# Patient Record
Sex: Female | Born: 1975 | Race: White | Hispanic: No | Marital: Married | Smoking: Never smoker
Health system: Southern US, Community
[De-identification: ages and names within clinical notes are randomized; demographics above are authoritative.]

## PROBLEM LIST (undated history)

## (undated) HISTORY — PX: BREAST EXCISIONAL BIOPSY: SUR124

---

## 1997-10-22 ENCOUNTER — Other Ambulatory Visit: Admission: RE | Admit: 1997-10-22 | Discharge: 1997-10-22 | Payer: Self-pay | Admitting: Obstetrics & Gynecology

## 1997-11-26 ENCOUNTER — Inpatient Hospital Stay (HOSPITAL_COMMUNITY): Admission: AD | Admit: 1997-11-26 | Discharge: 1997-11-29 | Payer: Self-pay | Admitting: Obstetrics and Gynecology

## 1999-02-20 ENCOUNTER — Other Ambulatory Visit: Admission: RE | Admit: 1999-02-20 | Discharge: 1999-02-20 | Payer: Self-pay | Admitting: Obstetrics & Gynecology

## 2000-07-14 ENCOUNTER — Other Ambulatory Visit: Admission: RE | Admit: 2000-07-14 | Discharge: 2000-07-14 | Payer: Self-pay | Admitting: Obstetrics and Gynecology

## 2001-02-09 ENCOUNTER — Encounter (INDEPENDENT_AMBULATORY_CARE_PROVIDER_SITE_OTHER): Payer: Self-pay

## 2001-02-09 ENCOUNTER — Inpatient Hospital Stay (HOSPITAL_COMMUNITY): Admission: AD | Admit: 2001-02-09 | Discharge: 2001-02-12 | Payer: Self-pay | Admitting: Obstetrics and Gynecology

## 2001-03-15 ENCOUNTER — Other Ambulatory Visit: Admission: RE | Admit: 2001-03-15 | Discharge: 2001-03-15 | Payer: Self-pay | Admitting: Obstetrics and Gynecology

## 2002-06-06 ENCOUNTER — Other Ambulatory Visit: Admission: RE | Admit: 2002-06-06 | Discharge: 2002-06-06 | Payer: Self-pay | Admitting: Obstetrics and Gynecology

## 2004-03-20 ENCOUNTER — Encounter: Admission: RE | Admit: 2004-03-20 | Discharge: 2004-03-20 | Payer: Self-pay | Admitting: Obstetrics and Gynecology

## 2004-09-21 ENCOUNTER — Other Ambulatory Visit: Admission: RE | Admit: 2004-09-21 | Discharge: 2004-09-21 | Payer: Self-pay | Admitting: Obstetrics and Gynecology

## 2006-07-07 ENCOUNTER — Encounter: Admission: RE | Admit: 2006-07-07 | Discharge: 2006-07-07 | Payer: Self-pay | Admitting: Obstetrics and Gynecology

## 2008-08-14 ENCOUNTER — Encounter: Admission: RE | Admit: 2008-08-14 | Discharge: 2008-08-14 | Payer: Self-pay | Admitting: Interventional Radiology

## 2010-03-19 ENCOUNTER — Encounter: Admission: RE | Admit: 2010-03-19 | Discharge: 2010-03-19 | Payer: Self-pay | Admitting: Obstetrics and Gynecology

## 2010-07-12 ENCOUNTER — Encounter: Payer: Self-pay | Admitting: Obstetrics and Gynecology

## 2010-11-06 NOTE — H&P (Signed)
Wendy Bauer, Wendy Bauer              ACCOUNT NO.:  [id number]   MEDICAL RECORD NO.:  [id number]          PATIENT TYPE:  AMB   LOCATION:  SDC                           FACILITY:  [facility]   PHYSICIAN:  Guy Sandifer. Henderson Cloud, M.D. DATE OF BIRTH:  1976-04-11   DATE OF ADMISSION:  08/08/2007  DATE OF DISCHARGE:                              HISTORY & PHYSICAL   CHIEF COMPLAINT:  Desires permanent sterilization.   HISTORY OF PRESENT ILLNESS:  This patient is a 35 year old, married,  white female, G3, P2 with a Jearld Adjutant IUD placed in December 2003.  She has  been using backup contraception and desires permanent sterilization.  She is being admitted for IUD removal as well as laparoscopic bilateral  tubal ligation.  Alternate methods of contraception have been reviewed  as well as risks and complications, permanence and failure rate of tubal  ligation.   PAST MEDICAL HISTORY:  Negative.   PAST SURGICAL HISTORY:  Negative.   FAMILY HISTORY:  Positive for heart attack in maternal grandmother.  Hypertension in sister.  Diabetes in paternal grandmother.  Cancer in  maternal aunt.  CVA in maternal grandmother.   MEDICATIONS:  None.   ALLERGIES:  No known drug allergies.   SOCIAL HISTORY:  Denies tobacco, alcohol or drug abuse.   PAST OBSTETRICAL HISTORY:  Vaginal delivery x2, miscarriage x1.   REVIEW OF SYSTEMS:  NEUROLOGIC:  Denies headache.  CARDIOVASCULAR:  No  chest pain.  PULMONARY:  Denies shortness of breath.  GI:  Denies recent  changes in bowel habits.   PHYSICAL EXAMINATION:  VITAL SIGNS:  Height 5 feet 1 inch, weight 123  pounds, blood pressure 116/80.  HEENT:  Without thyromegaly.  LUNGS:  Clear to auscultation.  HEART:  Regular rate and rhythm.  BACK:  No CVA tenderness.  BREASTS:  No mass, retraction or discharge.  ABDOMEN:  Soft, nontender without masses.  PELVIC:  Vagina and cervix without lesions.  Uterus normal size, mobile,  nontender.  Adnexa nontender without masses.  IUD  string is noted.  EXTREMITIES:  Grossly within normal limits.  NEUROLOGIC:  Grossly within normal limits.   ASSESSMENT:  Desires permanent sterilization.   PLAN:  IUD removal and laparoscopic bilateral tubal ligation.      Guy Sandifer Henderson Cloud, M.D.  Electronically Signed     JET/MEDQ  D:  08/04/2007  T:  08/07/2007  Job:  [id number]

## 2011-06-01 ENCOUNTER — Other Ambulatory Visit (HOSPITAL_COMMUNITY): Payer: Self-pay

## 2011-06-04 ENCOUNTER — Ambulatory Visit (HOSPITAL_COMMUNITY)
Admission: RE | Admit: 2011-06-04 | Payer: PRIVATE HEALTH INSURANCE | Source: Ambulatory Visit | Admitting: Obstetrics and Gynecology

## 2011-06-04 ENCOUNTER — Encounter (HOSPITAL_COMMUNITY): Admission: RE | Payer: Self-pay | Source: Ambulatory Visit

## 2011-06-04 SURGERY — LIGATION, FALLOPIAN TUBE, LAPAROSCOPIC
Anesthesia: General | Laterality: Bilateral

## 2011-09-07 ENCOUNTER — Ambulatory Visit (INDEPENDENT_AMBULATORY_CARE_PROVIDER_SITE_OTHER): Payer: Self-pay | Admitting: General Surgery

## 2013-04-05 ENCOUNTER — Telehealth: Payer: Self-pay | Admitting: Obstetrics and Gynecology

## 2014-06-21 HISTORY — PX: HAND SURGERY: SHX662

## 2015-09-17 ENCOUNTER — Ambulatory Visit
Admission: RE | Admit: 2015-09-17 | Discharge: 2015-09-17 | Disposition: A | Discharge status: Home / Self Care | Payer: PRIVATE HEALTH INSURANCE | Source: Ambulatory Visit

## 2015-09-17 ENCOUNTER — Ambulatory Visit
Admission: RE | Admit: 2015-09-17 | Discharge: 2015-09-17 | Disposition: A | Discharge status: Home / Self Care | Payer: PRIVATE HEALTH INSURANCE | Source: Ambulatory Visit | Attending: Nurse Practitioner | Admitting: Nurse Practitioner

## 2015-09-17 ENCOUNTER — Other Ambulatory Visit: Payer: Self-pay | Admitting: Nurse Practitioner

## 2015-09-17 ENCOUNTER — Other Ambulatory Visit: Payer: Self-pay

## 2015-09-17 DIAGNOSIS — Z1231 Encounter for screening mammogram for malignant neoplasm of breast: Secondary | ICD-10-CM

## 2015-09-17 DIAGNOSIS — R928 Other abnormal and inconclusive findings on diagnostic imaging of breast: Secondary | ICD-10-CM

## 2017-02-22 ENCOUNTER — Ambulatory Visit
Admission: RE | Admit: 2017-02-22 | Discharge: 2017-02-22 | Disposition: A | Discharge status: Home / Self Care | Payer: PRIVATE HEALTH INSURANCE | Source: Ambulatory Visit | Attending: Nurse Practitioner | Admitting: Nurse Practitioner

## 2017-02-22 ENCOUNTER — Other Ambulatory Visit: Payer: Self-pay | Admitting: Nurse Practitioner

## 2017-02-22 DIAGNOSIS — Z1231 Encounter for screening mammogram for malignant neoplasm of breast: Secondary | ICD-10-CM

## 2018-03-28 ENCOUNTER — Other Ambulatory Visit: Payer: Self-pay | Admitting: Family Medicine

## 2018-03-28 DIAGNOSIS — Z1231 Encounter for screening mammogram for malignant neoplasm of breast: Secondary | ICD-10-CM

## 2018-03-29 ENCOUNTER — Ambulatory Visit
Admission: RE | Admit: 2018-03-29 | Discharge: 2018-03-29 | Disposition: A | Discharge status: Home / Self Care | Payer: PRIVATE HEALTH INSURANCE | Source: Ambulatory Visit | Attending: Family Medicine | Admitting: Family Medicine

## 2018-03-29 ENCOUNTER — Other Ambulatory Visit: Payer: Self-pay | Admitting: Family Medicine

## 2018-03-29 DIAGNOSIS — N6310 Unspecified lump in the right breast, unspecified quadrant: Secondary | ICD-10-CM

## 2018-03-29 DIAGNOSIS — Z1231 Encounter for screening mammogram for malignant neoplasm of breast: Secondary | ICD-10-CM

## 2019-04-10 ENCOUNTER — Other Ambulatory Visit: Payer: Self-pay | Admitting: Family Medicine

## 2019-04-10 DIAGNOSIS — N63 Unspecified lump in unspecified breast: Secondary | ICD-10-CM

## 2019-08-14 ENCOUNTER — Other Ambulatory Visit: Payer: Self-pay

## 2019-08-14 ENCOUNTER — Ambulatory Visit
Admission: RE | Admit: 2019-08-14 | Discharge: 2019-08-14 | Disposition: A | Discharge status: Home / Self Care | Payer: PRIVATE HEALTH INSURANCE | Source: Ambulatory Visit | Attending: Family Medicine | Admitting: Family Medicine

## 2019-08-14 DIAGNOSIS — N63 Unspecified lump in unspecified breast: Secondary | ICD-10-CM

## 2019-09-18 ENCOUNTER — Ambulatory Visit
Admission: RE | Admit: 2019-09-18 | Discharge: 2019-09-18 | Disposition: A | Discharge status: Home / Self Care | Payer: PRIVATE HEALTH INSURANCE | Source: Ambulatory Visit | Attending: Family Medicine | Admitting: Family Medicine

## 2019-09-18 ENCOUNTER — Other Ambulatory Visit: Payer: Self-pay | Admitting: Family Medicine

## 2019-09-18 DIAGNOSIS — R079 Chest pain, unspecified: Secondary | ICD-10-CM

## 2020-08-19 ENCOUNTER — Ambulatory Visit
Admission: RE | Admit: 2020-08-19 | Discharge: 2020-08-19 | Disposition: A | Discharge status: Home / Self Care | Payer: Managed Care, Other (non HMO) | Source: Ambulatory Visit | Attending: Family Medicine | Admitting: Family Medicine

## 2020-08-19 ENCOUNTER — Other Ambulatory Visit: Payer: Self-pay

## 2020-08-19 ENCOUNTER — Other Ambulatory Visit: Payer: Self-pay | Admitting: Family Medicine

## 2020-08-19 DIAGNOSIS — N63 Unspecified lump in unspecified breast: Secondary | ICD-10-CM

## 2021-10-09 IMAGING — MG DIGITAL DIAGNOSTIC BILAT W/ TOMO W/ CAD
8 series · 9 of 24 positions shown · non-contrast
Comparison: Previous exam(s).

CLINICAL DATA: Two year follow-up for probably benign fibrocystic
changes in the RIGHT breast.

EXAM:
DIGITAL DIAGNOSTIC BILATERAL MAMMOGRAM WITH TOMOSYNTHESIS AND CAD
TECHNIQUE: Bilateral digital diagnostic mammography and breast tomosynthesis
was performed. The images were evaluated with computer-aided
detection.

[R CC synth-2D]
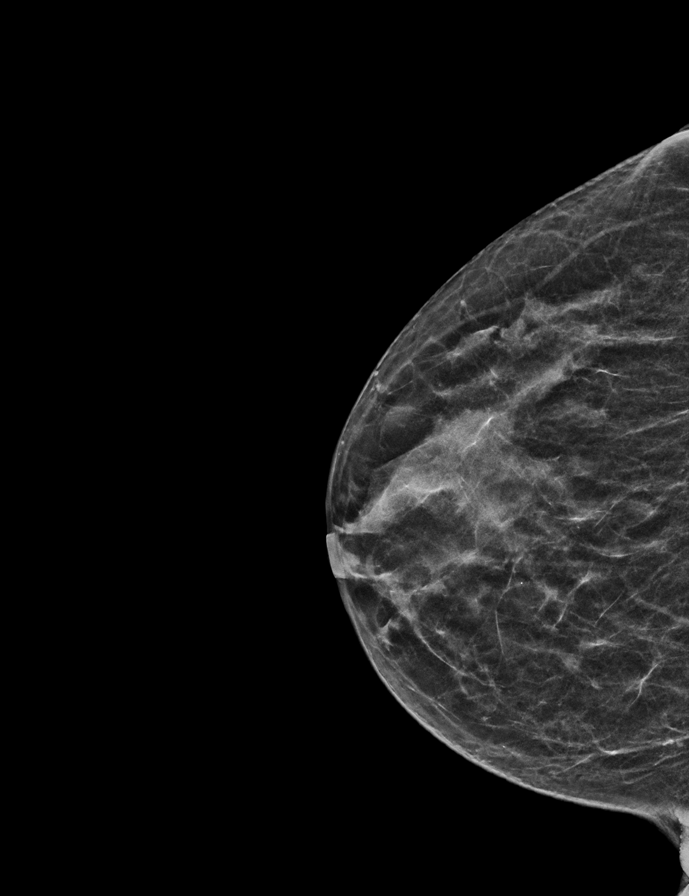

[L MLO synth-2D]
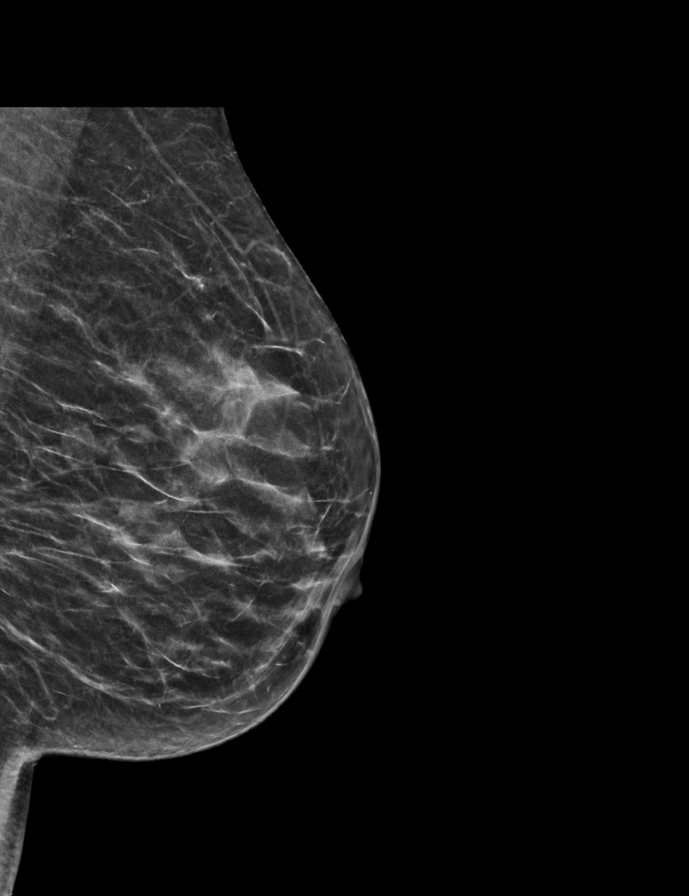

[R MLO synth-2D]
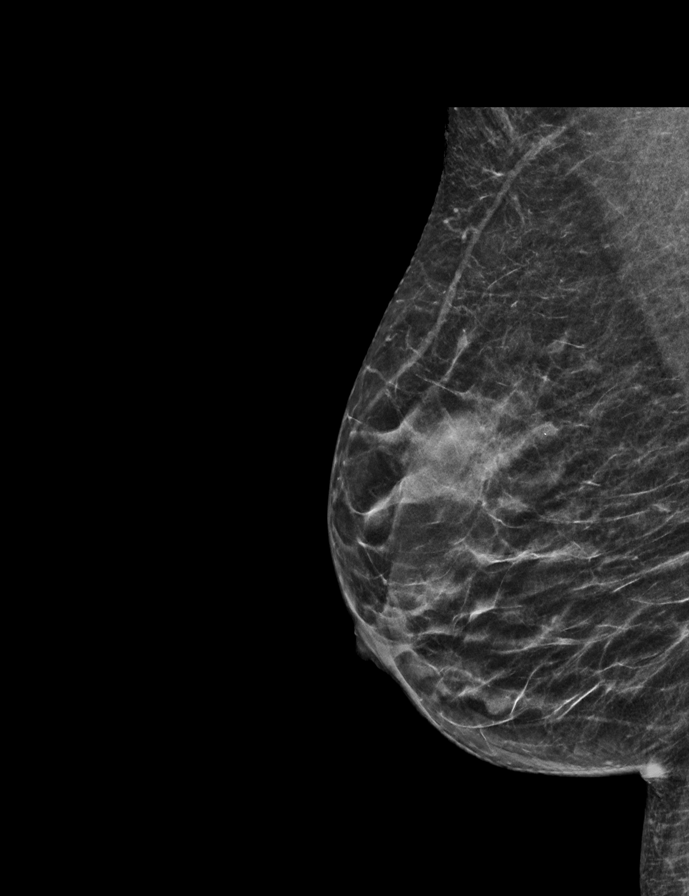

[L CC synth-2D]
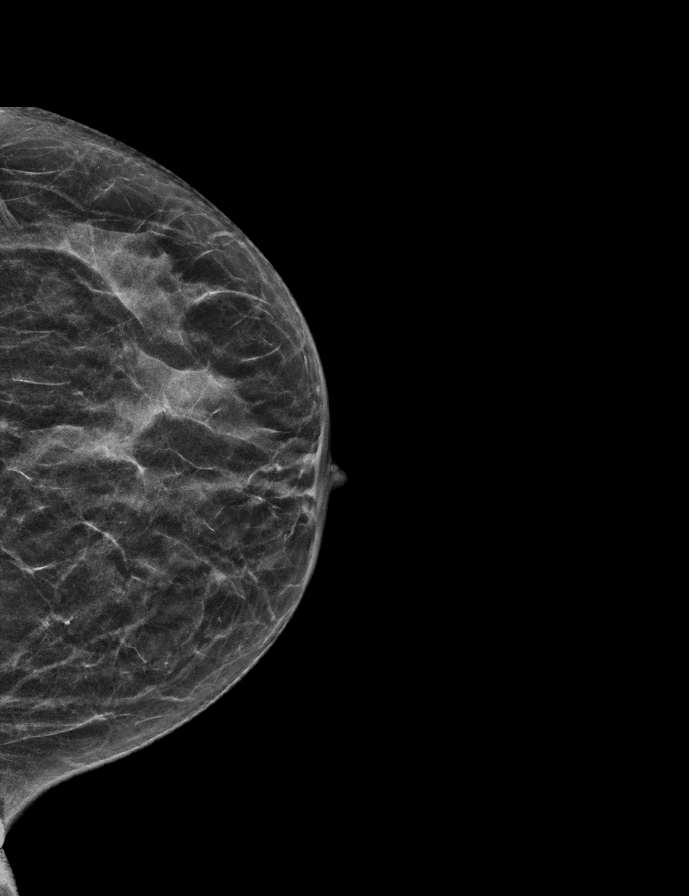

[L MLO tomo · 2 of 48 frames shown]
[frame 16/48]
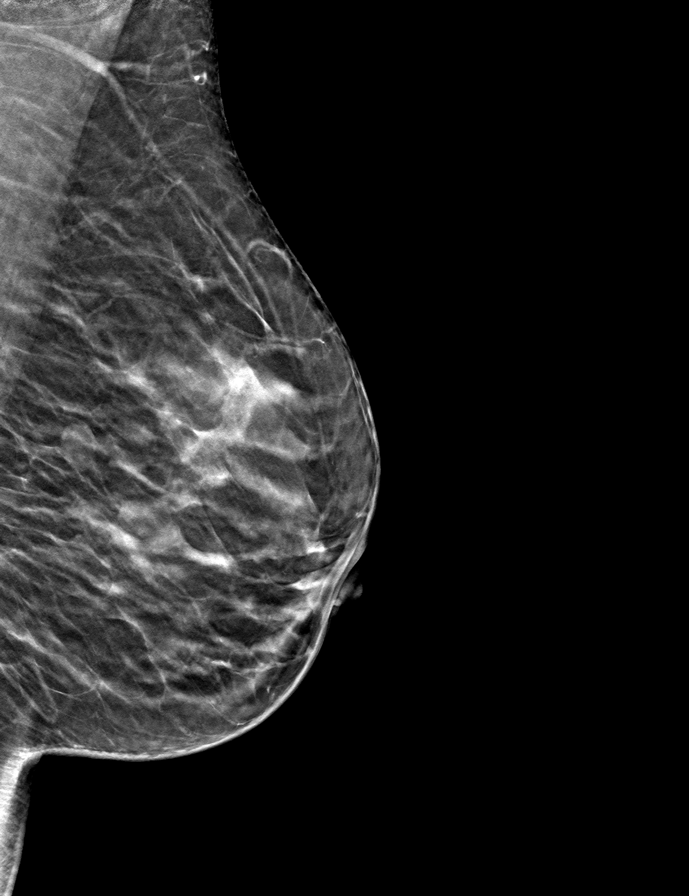
[frame 25/48]
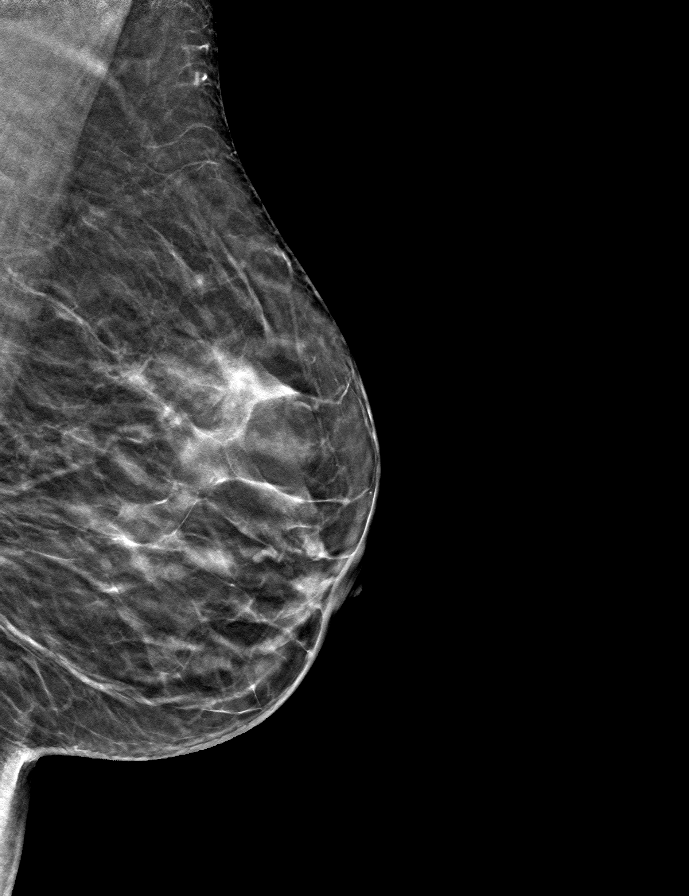

[R MLO tomo · tomo slice 23/46.0]
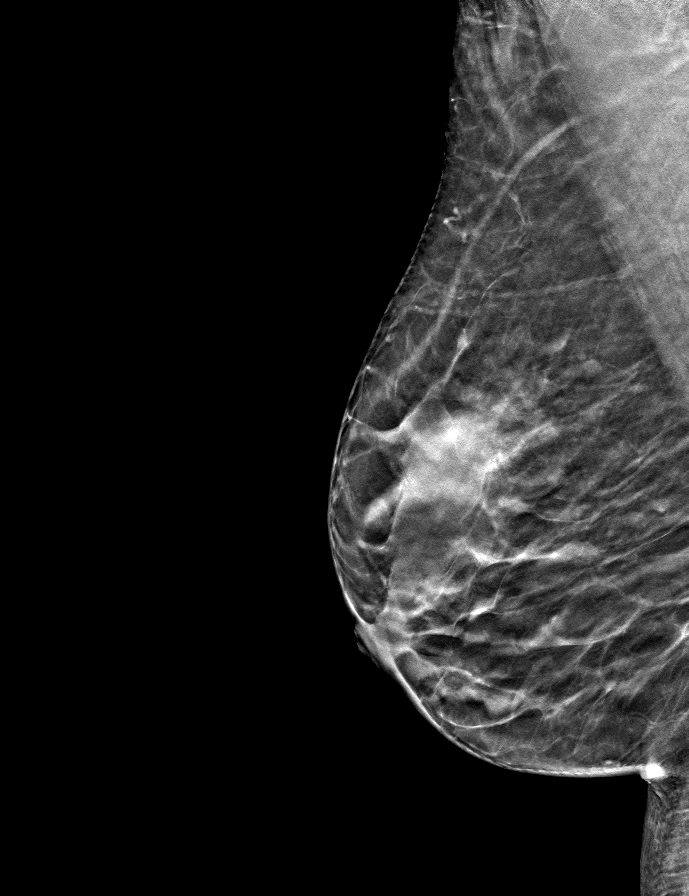

[L CC tomo · tomo slice 22/43.0]
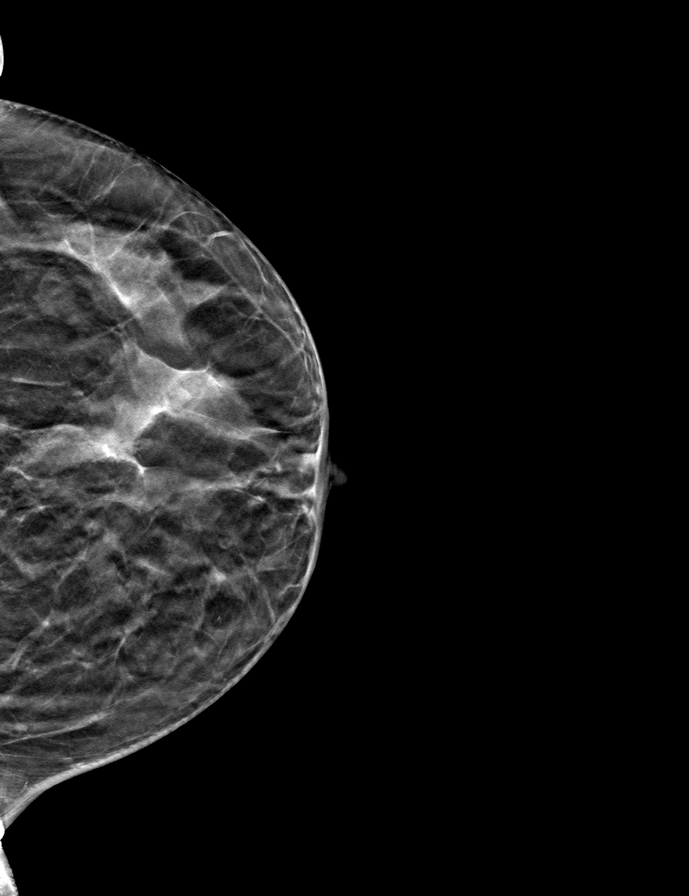

[R CC tomo · tomo slice 21/41.0]
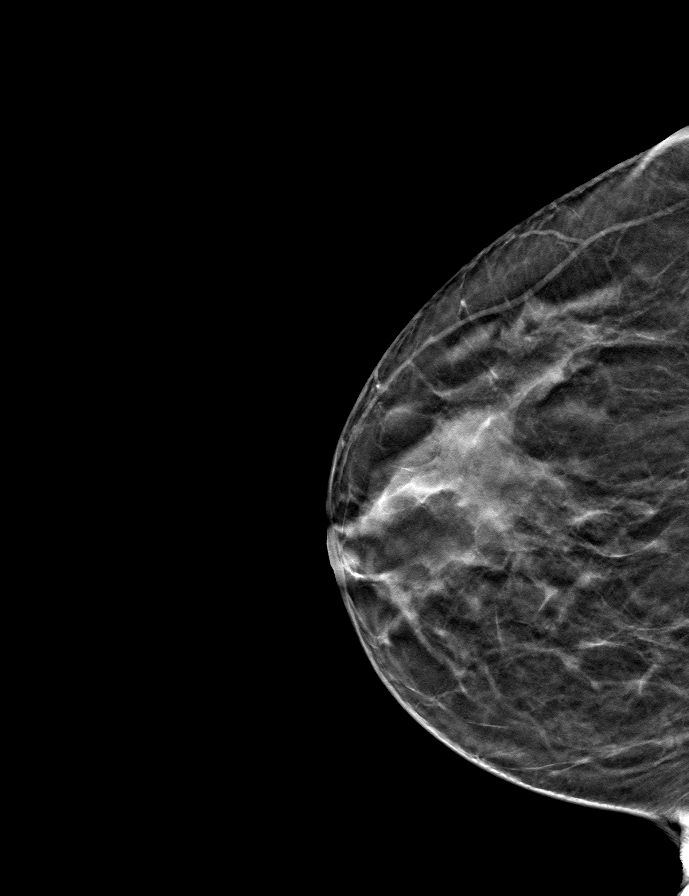

[9 of 24 positions shown; findings below may reference images not displayed]

ACR Breast Density Category b: There are scattered areas of
fibroglandular density.
FINDINGS: Stable appearance of asymmetry in the UPPER-OUTER QUADRANT of the
RIGHT breast, consistent with known fibrocystic changes on
ultrasound. No new or suspicious findings in either breast.
IMPRESSION: No mammographic evidence for malignancy. Stable fibrocystic changes
in the UPPER-OUTER QUADRANT of the RIGHT breast.

RECOMMENDATION:
Screening mammogram in one year.(Code:[id number])

I have discussed the findings and recommendations with the patient.
If applicable, a reminder letter will be sent to the patient
regarding the next appointment.

BI-RADS CATEGORY  2: Benign.

## 2022-03-23 ENCOUNTER — Ambulatory Visit
Admission: RE | Admit: 2022-03-23 | Discharge: 2022-03-23 | Disposition: A | Discharge status: Home / Self Care | Payer: Managed Care, Other (non HMO) | Source: Ambulatory Visit | Attending: Obstetrics & Gynecology | Admitting: Obstetrics & Gynecology

## 2022-03-23 ENCOUNTER — Other Ambulatory Visit: Payer: Self-pay | Admitting: Obstetrics & Gynecology

## 2022-03-23 DIAGNOSIS — R9230 Dense breasts, unspecified: Secondary | ICD-10-CM

## 2022-03-23 DIAGNOSIS — R921 Mammographic calcification found on diagnostic imaging of breast: Secondary | ICD-10-CM

## 2022-03-23 DIAGNOSIS — Z1231 Encounter for screening mammogram for malignant neoplasm of breast: Secondary | ICD-10-CM

## 2022-03-26 ENCOUNTER — Other Ambulatory Visit: Payer: Self-pay | Admitting: Obstetrics & Gynecology

## 2022-03-26 DIAGNOSIS — R921 Mammographic calcification found on diagnostic imaging of breast: Secondary | ICD-10-CM

## 2022-03-26 DIAGNOSIS — N6320 Unspecified lump in the left breast, unspecified quadrant: Secondary | ICD-10-CM

## 2022-03-30 ENCOUNTER — Ambulatory Visit
Admission: RE | Admit: 2022-03-30 | Discharge: 2022-03-30 | Disposition: A | Discharge status: Home / Self Care | Payer: Managed Care, Other (non HMO) | Source: Ambulatory Visit | Attending: Obstetrics & Gynecology | Admitting: Obstetrics & Gynecology

## 2022-03-30 DIAGNOSIS — R921 Mammographic calcification found on diagnostic imaging of breast: Secondary | ICD-10-CM

## 2022-03-30 DIAGNOSIS — N6320 Unspecified lump in the left breast, unspecified quadrant: Secondary | ICD-10-CM

## 2022-04-08 ENCOUNTER — Other Ambulatory Visit: Payer: Managed Care, Other (non HMO)

## 2022-04-21 ENCOUNTER — Other Ambulatory Visit: Payer: Self-pay | Admitting: General Surgery

## 2022-04-21 DIAGNOSIS — N6092 Unspecified benign mammary dysplasia of left breast: Secondary | ICD-10-CM

## 2022-06-30 ENCOUNTER — Encounter (HOSPITAL_BASED_OUTPATIENT_CLINIC_OR_DEPARTMENT_OTHER): Payer: Self-pay | Admitting: General Surgery

## 2022-07-06 ENCOUNTER — Ambulatory Visit
Admission: RE | Admit: 2022-07-06 | Discharge: 2022-07-06 | Disposition: A | Discharge status: Home / Self Care | Payer: Managed Care, Other (non HMO) | Source: Ambulatory Visit | Attending: General Surgery | Admitting: General Surgery

## 2022-07-06 DIAGNOSIS — N6092 Unspecified benign mammary dysplasia of left breast: Secondary | ICD-10-CM

## 2022-07-06 HISTORY — PX: BREAST BIOPSY: SHX20

## 2022-07-06 MED ORDER — ENSURE PRE-SURGERY PO LIQD: 296.0000 mL | Freq: Once | ORAL | Status: DC

## 2022-07-06 NOTE — Progress Notes (Signed)

## 2022-07-07 ENCOUNTER — Encounter (HOSPITAL_BASED_OUTPATIENT_CLINIC_OR_DEPARTMENT_OTHER)
Admission: RE | Disposition: A | Discharge status: Home / Self Care | Payer: Self-pay | Source: Home / Self Care | Attending: General Surgery

## 2022-07-07 ENCOUNTER — Other Ambulatory Visit: Payer: Self-pay

## 2022-07-07 ENCOUNTER — Ambulatory Visit
Admission: RE | Admit: 2022-07-07 | Discharge: 2022-07-07 | Disposition: A | Discharge status: Home / Self Care | Payer: Managed Care, Other (non HMO) | Source: Ambulatory Visit | Attending: General Surgery | Admitting: General Surgery

## 2022-07-07 ENCOUNTER — Encounter (HOSPITAL_BASED_OUTPATIENT_CLINIC_OR_DEPARTMENT_OTHER): Payer: Self-pay | Admitting: General Surgery

## 2022-07-07 ENCOUNTER — Ambulatory Visit (HOSPITAL_BASED_OUTPATIENT_CLINIC_OR_DEPARTMENT_OTHER): Payer: Managed Care, Other (non HMO) | Admitting: Certified Registered Nurse Anesthetist

## 2022-07-07 ENCOUNTER — Ambulatory Visit (HOSPITAL_BASED_OUTPATIENT_CLINIC_OR_DEPARTMENT_OTHER)
Admission: RE | Admit: 2022-07-07 | Discharge: 2022-07-07 | Disposition: A | Discharge status: Home / Self Care | Payer: Managed Care, Other (non HMO) | Attending: General Surgery | Admitting: General Surgery

## 2022-07-07 DIAGNOSIS — N6320 Unspecified lump in the left breast, unspecified quadrant: Secondary | ICD-10-CM | POA: Diagnosis not present

## 2022-07-07 DIAGNOSIS — N6082 Other benign mammary dysplasias of left breast: Secondary | ICD-10-CM | POA: Insufficient documentation

## 2022-07-07 DIAGNOSIS — N6092 Unspecified benign mammary dysplasia of left breast: Secondary | ICD-10-CM

## 2022-07-07 DIAGNOSIS — Z803 Family history of malignant neoplasm of breast: Secondary | ICD-10-CM | POA: Insufficient documentation

## 2022-07-07 DIAGNOSIS — Z419 Encounter for procedure for purposes other than remedying health state, unspecified: Secondary | ICD-10-CM

## 2022-07-07 HISTORY — PX: RADIOACTIVE SEED GUIDED EXCISIONAL BREAST BIOPSY: SHX6490

## 2022-07-07 LAB — POCT PREGNANCY, URINE: Preg Test, Ur: NEGATIVE

## 2022-07-07 SURGERY — RADIOACTIVE SEED GUIDED BREAST BIOPSY
Anesthesia: General | Site: Breast | Laterality: Left

## 2022-07-07 MED ORDER — OXYCODONE HCL 5 MG/5ML PO SOLN: 5.0000 mg | Freq: Once | ORAL | Status: AC | PRN

## 2022-07-07 MED ORDER — GABAPENTIN 300 MG PO CAPS
ORAL_CAPSULE | ORAL | Status: AC
  Filled 2022-07-07: qty 1

## 2022-07-07 MED ORDER — FENTANYL CITRATE (PF) 100 MCG/2ML IJ SOLN
INTRAMUSCULAR | Status: AC
  Filled 2022-07-07: qty 2

## 2022-07-07 MED ORDER — MIDAZOLAM HCL 5 MG/5ML IJ SOLN
INTRAMUSCULAR | Status: DC | PRN
  Administered 2022-07-07: 2 mg via INTRAVENOUS

## 2022-07-07 MED ORDER — LIDOCAINE 2% (20 MG/ML) 5 ML SYRINGE
INTRAMUSCULAR | Status: AC
  Filled 2022-07-07: qty 5

## 2022-07-07 MED ORDER — KETOROLAC TROMETHAMINE 15 MG/ML IJ SOLN
INTRAMUSCULAR | Status: AC
  Filled 2022-07-07: qty 1

## 2022-07-07 MED ORDER — GABAPENTIN 300 MG PO CAPS
300.0000 mg | ORAL_CAPSULE | Freq: Once | ORAL | Status: AC
  Administered 2022-07-07: 300 mg via ORAL

## 2022-07-07 MED ORDER — HYDROMORPHONE HCL 1 MG/ML IJ SOLN: 0.2500 mg | INTRAMUSCULAR | Status: DC | PRN

## 2022-07-07 MED ORDER — ONDANSETRON HCL 4 MG/2ML IJ SOLN
INTRAMUSCULAR | Status: AC
  Filled 2022-07-07: qty 2

## 2022-07-07 MED ORDER — DEXAMETHASONE SODIUM PHOSPHATE 10 MG/ML IJ SOLN
INTRAMUSCULAR | Status: DC | PRN
  Administered 2022-07-07: 10 mg via INTRAVENOUS

## 2022-07-07 MED ORDER — LIDOCAINE HCL (CARDIAC) PF 100 MG/5ML IV SOSY
PREFILLED_SYRINGE | INTRAVENOUS | Status: DC | PRN
  Administered 2022-07-07: 60 mg via INTRAVENOUS

## 2022-07-07 MED ORDER — AMISULPRIDE (ANTIEMETIC) 5 MG/2ML IV SOLN: 10.0000 mg | Freq: Once | INTRAVENOUS | Status: DC | PRN

## 2022-07-07 MED ORDER — CHLORHEXIDINE GLUCONATE CLOTH 2 % EX PADS: 6.0000 | MEDICATED_PAD | Freq: Once | CUTANEOUS | Status: DC

## 2022-07-07 MED ORDER — BUPIVACAINE HCL (PF) 0.25 % IJ SOLN
INTRAMUSCULAR | Status: DC | PRN
  Administered 2022-07-07: 10 mL

## 2022-07-07 MED ORDER — CEFAZOLIN SODIUM-DEXTROSE 2-4 GM/100ML-% IV SOLN
2.0000 g | INTRAVENOUS | Status: AC
  Administered 2022-07-07: 2 g via INTRAVENOUS

## 2022-07-07 MED ORDER — ACETAMINOPHEN 500 MG PO TABS
1000.0000 mg | ORAL_TABLET | ORAL | Status: AC
  Administered 2022-07-07: 1000 mg via ORAL

## 2022-07-07 MED ORDER — MEPERIDINE HCL 25 MG/ML IJ SOLN: 6.2500 mg | INTRAMUSCULAR | Status: DC | PRN

## 2022-07-07 MED ORDER — PROPOFOL 10 MG/ML IV BOLUS
INTRAVENOUS | Status: AC
  Filled 2022-07-07: qty 20

## 2022-07-07 MED ORDER — PROMETHAZINE HCL 25 MG/ML IJ SOLN: 6.2500 mg | INTRAMUSCULAR | Status: DC | PRN

## 2022-07-07 MED ORDER — FENTANYL CITRATE (PF) 100 MCG/2ML IJ SOLN
INTRAMUSCULAR | Status: DC | PRN
  Administered 2022-07-07 (×2): 25 ug via INTRAVENOUS

## 2022-07-07 MED ORDER — ONDANSETRON HCL 4 MG/2ML IJ SOLN
INTRAMUSCULAR | Status: DC | PRN
  Administered 2022-07-07: 4 mg via INTRAVENOUS

## 2022-07-07 MED ORDER — SCOPOLAMINE 1 MG/3DAYS TD PT72
1.0000 | MEDICATED_PATCH | TRANSDERMAL | Status: DC
  Administered 2022-07-07: 1.5 mg via TRANSDERMAL

## 2022-07-07 MED ORDER — OXYCODONE HCL 5 MG PO TABS
ORAL_TABLET | ORAL | Status: AC
  Filled 2022-07-07: qty 1

## 2022-07-07 MED ORDER — SCOPOLAMINE 1 MG/3DAYS TD PT72
MEDICATED_PATCH | TRANSDERMAL | Status: AC
  Filled 2022-07-07: qty 1

## 2022-07-07 MED ORDER — CEFAZOLIN SODIUM-DEXTROSE 2-4 GM/100ML-% IV SOLN
INTRAVENOUS | Status: AC
  Filled 2022-07-07: qty 100

## 2022-07-07 MED ORDER — OXYCODONE HCL 5 MG PO TABS
5.0000 mg | ORAL_TABLET | Freq: Once | ORAL | Status: AC | PRN
  Administered 2022-07-07: 5 mg via ORAL

## 2022-07-07 MED ORDER — PROPOFOL 10 MG/ML IV BOLUS
INTRAVENOUS | Status: DC | PRN
  Administered 2022-07-07: 120 mg via INTRAVENOUS

## 2022-07-07 MED ORDER — DEXAMETHASONE SODIUM PHOSPHATE 10 MG/ML IJ SOLN
INTRAMUSCULAR | Status: AC
  Filled 2022-07-07: qty 1

## 2022-07-07 MED ORDER — LACTATED RINGERS IV SOLN: INTRAVENOUS | Status: DC

## 2022-07-07 MED ORDER — ACETAMINOPHEN 500 MG PO TABS
ORAL_TABLET | ORAL | Status: AC
  Filled 2022-07-07: qty 2

## 2022-07-07 MED ORDER — KETOROLAC TROMETHAMINE 15 MG/ML IJ SOLN
15.0000 mg | INTRAMUSCULAR | Status: AC
  Administered 2022-07-07: 15 mg via INTRAVENOUS

## 2022-07-07 MED ORDER — MIDAZOLAM HCL 2 MG/2ML IJ SOLN
INTRAMUSCULAR | Status: AC
  Filled 2022-07-07: qty 2

## 2022-07-07 SURGICAL SUPPLY — 54 items
ADH SKN CLS APL DERMABOND .7 (GAUZE/BANDAGES/DRESSINGS) ×1
APL PRP STRL LF DISP 70% ISPRP (MISCELLANEOUS) ×1
APPLIER CLIP 9.375 MED OPEN (MISCELLANEOUS)
APR CLP MED 9.3 20 MLT OPN (MISCELLANEOUS)
BINDER BREAST LRG (GAUZE/BANDAGES/DRESSINGS) IMPLANT
BINDER BREAST MEDIUM (GAUZE/BANDAGES/DRESSINGS) IMPLANT
BINDER BREAST XLRG (GAUZE/BANDAGES/DRESSINGS) IMPLANT
BINDER BREAST XXLRG (GAUZE/BANDAGES/DRESSINGS) IMPLANT
BLADE SURG 15 STRL LF DISP TIS (BLADE) ×2 IMPLANT
BLADE SURG 15 STRL SS (BLADE) ×1
CANISTER SUC SOCK COL 7IN (MISCELLANEOUS) IMPLANT
CANISTER SUCT 1200ML W/VALVE (MISCELLANEOUS) IMPLANT
CHLORAPREP W/TINT 26 (MISCELLANEOUS) ×2 IMPLANT
CLIP APPLIE 9.375 MED OPEN (MISCELLANEOUS) IMPLANT
CLIP TI WIDE RED SMALL 6 (CLIP) IMPLANT
COVER BACK TABLE 60X90IN (DRAPES) ×2 IMPLANT
COVER MAYO STAND STRL (DRAPES) ×2 IMPLANT
COVER PROBE CYLINDRICAL 5X96 (MISCELLANEOUS) ×2 IMPLANT
DERMABOND ADVANCED .7 DNX12 (GAUZE/BANDAGES/DRESSINGS) ×2 IMPLANT
DRAPE LAPAROSCOPIC ABDOMINAL (DRAPES) ×2 IMPLANT
DRAPE UTILITY XL STRL (DRAPES) ×2 IMPLANT
DRSG TEGADERM 4X4.75 (GAUZE/BANDAGES/DRESSINGS) IMPLANT
ELECT COATED BLADE 2.86 ST (ELECTRODE) ×2 IMPLANT
ELECT REM PT RETURN 9FT ADLT (ELECTROSURGICAL) ×1
ELECTRODE REM PT RTRN 9FT ADLT (ELECTROSURGICAL) ×2 IMPLANT
GAUZE SPONGE 4X4 12PLY STRL LF (GAUZE/BANDAGES/DRESSINGS) IMPLANT
GLOVE BIO SURGEON STRL SZ7 (GLOVE) ×4 IMPLANT
GLOVE BIOGEL PI IND STRL 7.5 (GLOVE) ×2 IMPLANT
GOWN STRL REUS W/ TWL LRG LVL3 (GOWN DISPOSABLE) ×4 IMPLANT
GOWN STRL REUS W/TWL LRG LVL3 (GOWN DISPOSABLE) ×4
HEMOSTAT ARISTA ABSORB 3G PWDR (HEMOSTASIS) IMPLANT
KIT MARKER MARGIN INK (KITS) ×2 IMPLANT
NDL HYPO 25X1 1.5 SAFETY (NEEDLE) ×2 IMPLANT
NEEDLE HYPO 25X1 1.5 SAFETY (NEEDLE) ×1 IMPLANT
NS IRRIG 1000ML POUR BTL (IV SOLUTION) IMPLANT
PACK BASIN DAY SURGERY FS (CUSTOM PROCEDURE TRAY) ×2 IMPLANT
PENCIL SMOKE EVACUATOR (MISCELLANEOUS) ×2 IMPLANT
RETRACTOR ONETRAX LX 90X20 (MISCELLANEOUS) IMPLANT
SLEEVE SCD COMPRESS KNEE MED (STOCKING) ×2 IMPLANT
SPIKE FLUID TRANSFER (MISCELLANEOUS) IMPLANT
SPONGE T-LAP 4X18 ~~LOC~~+RFID (SPONGE) ×2 IMPLANT
STRIP CLOSURE SKIN 1/2X4 (GAUZE/BANDAGES/DRESSINGS) ×2 IMPLANT
SUT MNCRL AB 4-0 PS2 18 (SUTURE) IMPLANT
SUT MON AB 5-0 PS2 18 (SUTURE) IMPLANT
SUT SILK 2 0 SH (SUTURE) IMPLANT
SUT VIC AB 2-0 SH 27 (SUTURE) ×1
SUT VIC AB 2-0 SH 27XBRD (SUTURE) ×2 IMPLANT
SUT VIC AB 3-0 SH 27 (SUTURE) ×1
SUT VIC AB 3-0 SH 27X BRD (SUTURE) ×2 IMPLANT
SYR CONTROL 10ML LL (SYRINGE) ×2 IMPLANT
TOWEL GREEN STERILE FF (TOWEL DISPOSABLE) ×2 IMPLANT
TRAY FAXITRON CT DISP (TRAY / TRAY PROCEDURE) ×2 IMPLANT
TUBE CONNECTING 20X1/4 (TUBING) IMPLANT
YANKAUER SUCT BULB TIP NO VENT (SUCTIONS) IMPLANT

## 2022-07-07 NOTE — Anesthesia Postprocedure Evaluation (Signed)
Anesthesia Post Note  Patient: Wendy Bauer  Procedure(s) Performed: RADIOACTIVE SEED GUIDED EXCISIONAL LEFT BREAST BIOPSY (Left: Breast)     Patient location during evaluation: PACU Anesthesia Type: General Level of consciousness: sedated and patient cooperative Pain management: pain level controlled Vital Signs Assessment: post-procedure vital signs reviewed and stable Respiratory status: spontaneous breathing Cardiovascular status: stable Anesthetic complications: no   No notable events documented.  Last Vitals:  Vitals:   07/07/22 1355 07/07/22 1410  BP:  (!) 127/90  Pulse: 74 74  Resp: (!) 22   Temp:  (!) 36.4 C  SpO2: 100% 100%    Last Pain:  Vitals:   07/07/22 1410  TempSrc: Oral  PainSc: [place]

## 2022-07-07 NOTE — Transfer of Care (Signed)
Immediate Anesthesia Transfer of Care Note  Patient: Wendy Bauer  Procedure(s) Performed: RADIOACTIVE SEED GUIDED EXCISIONAL LEFT BREAST BIOPSY (Left: Breast)  Patient Location: PACU  Anesthesia Type:General  Level of Consciousness: drowsy  Airway & Oxygen Therapy: Patient Spontanous Breathing and Patient connected to face mask oxygen  Post-op Assessment: Report given to RN and Post -op Vital signs reviewed and stable  Post vital signs: Reviewed and stable  Last Vitals:  Vitals Value Taken Time  BP 121/85 (96)   Temp    Pulse 93 07/07/22 1333  Resp 19 07/07/22 1333  SpO2 100 % 07/07/22 1333  Vitals shown include unvalidated device data.  Last Pain:  Vitals:   07/07/22 1146  TempSrc: Temporal  PainSc: 0-No pain         Complications: No notable events documented.

## 2022-07-07 NOTE — Discharge Instructions (Addendum)
[place] Office Phone Number [phone]  POST OP INSTRUCTIONS Take 400 mg of ibuprofen every 8 hours or 650 mg tylenol every 6 hours for next 72 hours then as needed. Use ice several times daily also.  A prescription for pain medication may be given to you upon discharge.  Take your pain medication as prescribed, if needed.  If narcotic pain medicine is not needed, then you may take acetaminophen (Tylenol), naprosyn (Alleve) or ibuprofen (Advil) as needed. Take your usually prescribed medications unless otherwise directed If you need a refill on your pain medication, please contact your pharmacy.  They will contact our office to request authorization.  Prescriptions will not be filled after 5pm or on week-ends. You should eat very light the first 24 hours after surgery, such as soup, crackers, pudding, etc.  Resume your normal diet the day after surgery. Most patients will experience some swelling and bruising in the breast.  Ice packs and a good support bra will help.  Wear the breast binder provided or a sports bra for 72 hours day and night.  After that wear a sports bra during the day until you return to the office. Swelling and bruising can take several days to resolve.  It is common to experience some constipation if taking pain medication after surgery.  Increasing fluid intake and taking a stool softener will usually help or prevent this problem from occurring.  A mild laxative (Milk of Magnesia or Miralax) should be taken according to package directions if there are no bowel movements after 48 hours. I used skin glue on the incision, you may shower in 24 hours.  The glue will flake off over the next 2-3 weeks.  Any sutures or staples will be removed at the office during your follow-up visit. ACTIVITIES:  You may resume regular daily activities (gradually increasing) beginning the next day.  Wearing a good support bra or sports bra minimizes pain and swelling.  You may have  sexual intercourse when it is comfortable. You may drive when you no longer are taking prescription pain medication, you can comfortably wear a seatbelt, and you can safely maneuver your car and apply brakes. RETURN TO WORK:  ______________________________________________________________________________________ Wendy Bauer Bast should see your doctor in the office for a follow-up appointment approximately two weeks after your surgery.  Your doctor's nurse will typically make your follow-up appointment when she calls you with your pathology report.  Expect your pathology report 3-4 business days after your surgery.  You may call to check if you do not hear from [place] after three days. OTHER INSTRUCTIONS: _______________________________________________________________________________________________ _____________________________________________________________________________________________________________________________________ _____________________________________________________________________________________________________________________________________ _____________________________________________________________________________________________________________________________________  WHEN TO CALL DR [place]KEFIELD: Fever over 101.0 Nausea and/or vomiting. Extreme swelling or bruising. Continued bleeding from incision. Increased pain, redness, or drainage from the incision.  The clinic staff is available to answer your questions during regular business hours.  Please don't hesitate to call and ask to speak to one of the nurses for clinical concerns.  If you have a medical emergency, go to the nearest emergency room or call 911.  A surgeon from [facility] is always on call at the hospital.  For further questions, please visit [email or web address]   Post Anesthesia Home Care Instructions  Activity: Get plenty of rest for the remainder of the day. A responsible individual must stay  with you for 24 hours following the procedure.  For the next 24 hours, DO NOT: -Drive a car -Paediatric nurse -Drink alcoholic beverages -Take any medication unless instructed by  your physician -Make any legal decisions or sign important papers.  Meals: Start with liquid foods such as gelatin or soup. Progress to regular foods as tolerated. Avoid greasy, spicy, heavy foods. If nausea and/or vomiting occur, drink only clear liquids until the nausea and/or vomiting subsides. Call your physician if vomiting continues.  Special Instructions/Symptoms: Your throat may feel dry or sore from the anesthesia or the breathing tube placed in your throat during surgery. If this causes discomfort, gargle with warm salt water. The discomfort should disappear within 24 hours.  If you had a scopolamine patch placed behind your ear for the management of post- operative nausea and/or vomiting:  1. The medication in the patch is effective for 72 hours, after which it should be removed.  Wrap patch in a tissue and discard in the trash. Wash hands thoroughly with soap and water. 2. You may remove the patch earlier than 72 hours if you experience unpleasant side effects which may include dry mouth, dizziness or visual disturbances. 3. Avoid touching the patch. Wash your hands with soap and water after contact with the patch.    Post Anesthesia Home Care Instructions  Activity: Get plenty of rest for the remainder of the day. A responsible individual must stay with you for 24 hours following the procedure.  For the next 24 hours, DO NOT: -Drive a car -Paediatric nurse -Drink alcoholic beverages -Take any medication unless instructed by your physician -Make any legal decisions or sign important papers.  Meals: Start with liquid foods such as gelatin or soup. Progress to regular foods as tolerated. Avoid greasy, spicy, heavy foods. If nausea and/or vomiting occur, drink only clear liquids until the nausea  and/or vomiting subsides. Call your physician if vomiting continues.  Special Instructions/Symptoms: Your throat may feel dry or sore from the anesthesia or the breathing tube placed in your throat during surgery. If this causes discomfort, gargle with warm salt water. The discomfort should disappear within 24 hours.  If you had a scopolamine patch placed behind your ear for the management of post- operative nausea and/or vomiting:  1. The medication in the patch is effective for 72 hours, after which it should be removed.  Wrap patch in a tissue and discard in the trash. Wash hands thoroughly with soap and water. 2. You may remove the patch earlier than 72 hours if you experience unpleasant side effects which may include dry mouth, dizziness or visual disturbances. 3. Avoid touching the patch. Wash your hands with soap and water after contact with the patch.     Next dose of Tylenol or Ibuprofen at 6pm today if needed.

## 2022-07-07 NOTE — H&P (Signed)
  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 She has no prior breast history. She has a family history of a paternal aunt in her late 29s with breast cancer and 2 first cousins who may have been in their 68s. She has C density breast tissue. Initially she had a possible mass in the left breast as well as an upper outer quadrant area of calcifications. She underwent biopsies of both of these. The mass shows focal atypical ductal hyperplasia. The calcifications are just breast tissue and negative. These likely have all been removed at this point. She is referred for evaluation to consider removing the atypical ductal hyperplasia biopsy site. She has no mass or discharge otherwise.  Review of Systems: A complete review of systems was obtained from the patient. I have reviewed this information and discussed as appropriate with the patient. See HPI as well for other ROS.  Review of Systems Cardiovascular: Positive for palpitations. All other systems reviewed and are negative.  Medical History: History reviewed. No pertinent past medical history.  Past Surgical History: Procedure Laterality Date hand surgery Right  No Known Allergies  Current Outpatient Medications on File Prior to Visit Medication Sig Dispense Refill ALPRAZolam (XANAX) 0.5 MG tablet  Family History Problem Relation Age of Onset Coronary Artery Disease (Blocked arteries around heart) Mother High blood pressure (Hypertension) Sister   Social History  Tobacco Use Smoking Status Never Smokeless Tobacco Never Marital status: Married Tobacco Use Smoking status: Never Smokeless tobacco: Never Substance and Sexual Activity Alcohol use: Yes Comment: social Drug use: Never  Objective:  Vitals: Body mass index is 25.39 kg/m.  Physical Exam Vitals reviewed. Constitutional: Appearance: Normal appearance. Chest: Breasts: Left: No inverted nipple,  mass or nipple discharge. Lymphadenopathy: Upper Body: Left upper body: No supraclavicular or axillary adenopathy. Neurological: Mental Status: She is alert.   Assessment and Plan:  Atypical ductal hyperplasia of left breast  Left breast seed guided excisional biopsy  I think due to the atypical ductal hyperplasia, presence of a mass, and her family history this area should be excised. We discussed a seed guided excisional biopsy with the risks and recovery. She will be out of work for 2 weeks after this. Once we get this pathology we can also discuss following her as this may very well put her in the high risk category. We could also discuss possible genetic testing given her family history.

## 2022-07-07 NOTE — Interval H&P Note (Signed)
History and Physical Interval Note:  07/07/2022 12:30 PM  Wendy Bauer  has presented today for surgery, with the diagnosis of LEFT BREAST MASS.  The various methods of treatment have been discussed with the patient and family. After consideration of risks, benefits and other options for treatment, the patient has consented to  Procedure(s): RADIOACTIVE SEED GUIDED EXCISIONAL LEFT BREAST BIOPSY (Left) as a surgical intervention.  The patient's history has been reviewed, patient examined, no change in status, stable for surgery.  I have reviewed the patient's chart and labs.  Questions were answered to the patient's satisfaction.     Rolm Bookbinder

## 2022-07-07 NOTE — Op Note (Signed)
Preoperative diagnosis: Left breast ADH on core biopsy Postoperative diagnosis: Same as above Procedure: Left breast radioactive seed guided excisional biopsy Surgeon: Dr. 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 She has no prior breast history. She has a family history of a paternal aunt in her late 58s with breast cancer and 2 first cousins who may have been in their 74s. She has C density breast tissue. Initially she had a possible mass in the left breast as well as an upper outer quadrant area of calcifications. She underwent biopsies of both of these. The mass shows focal atypical ductal hyperplasia. The calcifications are just breast tissue and negative. These likely have all been removed at this point. We discussed seed guided excision.    Procedure: After informed consent was obtained the patient was taken to the operating room.  She was given antibiotics.  SCDs were in place.  She was placed under general anesthesia without complication.  She was prepped and draped in the standard sterile surgical fashion.  Surgical timeout was then performed.   I identified the seed in the lower outer breast.  I infiltrated Marcaine.  I made an inframammary incision in order to hide the scar later.  I then dissected down to the seed.   I removed the seed and some of the surrounding tissue.  Mammogram confirmed removal of the seed as well as the clip.  This was sent to pathology.  I then obtained hemostasis.  I closed the breast tissue with 2-0 Vicryl.  The skin was closed with 3-0 Vicryl and 5-0 Monocryl.  Glue and Steri-Strips were applied.  She tolerated this  well was extubated and transferred to recovery stable.

## 2022-07-07 NOTE — Anesthesia Preprocedure Evaluation (Addendum)
Anesthesia Evaluation  Patient identified by MRN, date of birth, ID band Patient awake    Reviewed: Allergy & Precautions, NPO status , Patient's Chart, lab work & pertinent test results  Airway Mallampati: II  TM Distance: >3 FB Neck ROM: Full    Dental no notable dental hx. (+) Dental Advisory Given, Teeth Intact   Pulmonary neg pulmonary ROS   Pulmonary exam normal breath sounds clear to auscultation       Cardiovascular negative cardio ROS Normal cardiovascular exam Rhythm:Regular Rate:Normal     Neuro/Psych negative neurological ROS     GI/Hepatic negative GI ROS, Neg liver ROS,,,  Endo/Other  negative endocrine ROS    Renal/GU negative Renal ROS     Musculoskeletal negative musculoskeletal ROS (+)    Abdominal  (+) - obese  Peds  Hematology negative hematology ROS (+)   Anesthesia Other Findings   Reproductive/Obstetrics negative OB ROS                             Anesthesia Physical Anesthesia Plan  ASA: 2  Anesthesia Plan: General   Post-op Pain Management: Tylenol PO (pre-op)*, Gabapentin PO (pre-op)* and Toradol IV (intra-op)*   Induction: Intravenous  PONV Risk Score and Plan: 4 or greater and Ondansetron, Dexamethasone, Midazolam, Treatment may vary due to age or medical condition and Scopolamine patch - Pre-op  Airway Management Planned: LMA  Additional Equipment:   Intra-op Plan:   Post-operative Plan: Extubation in OR  Informed Consent: I have reviewed the patients History and Physical, chart, labs and discussed the procedure including the risks, benefits and alternatives for the proposed anesthesia with the patient or authorized representative who has indicated his/her understanding and acceptance.     Dental advisory given  Plan Discussed with: CRNA  Anesthesia Plan Comments:        Anesthesia Quick Evaluation

## 2022-07-07 NOTE — Anesthesia Procedure Notes (Signed)
Procedure Name: LMA Insertion Date/Time: 07/07/2022 12:43 PM  Performed by: Lavonia Dana, CRNAPre-anesthesia Checklist: Patient identified, Emergency Drugs available, Suction available and Patient being monitored Patient Re-evaluated:Patient Re-evaluated prior to induction Oxygen Delivery Method: Circle system utilized Preoxygenation: Pre-oxygenation with 100% oxygen Induction Type: IV induction Ventilation: Mask ventilation without difficulty LMA: LMA inserted LMA Size: 4.0 Number of attempts: 1 Airway Equipment and Method: Bite block Placement Confirmation: positive ETCO2 Tube secured with: Tape Dental Injury: Teeth and Oropharynx as per pre-operative assessment

## 2022-07-08 ENCOUNTER — Encounter (HOSPITAL_BASED_OUTPATIENT_CLINIC_OR_DEPARTMENT_OTHER): Payer: Self-pay | Admitting: General Surgery

## 2022-07-08 ENCOUNTER — Encounter (HOSPITAL_COMMUNITY): Payer: Self-pay

## 2022-07-08 NOTE — Progress Notes (Signed)
Left message stating courtesy call and if any questions or concerns please call the doctors office.  

## 2022-07-09 LAB — SURGICAL PATHOLOGY

## 2023-01-28 ENCOUNTER — Other Ambulatory Visit: Payer: Self-pay | Admitting: General Surgery

## 2023-01-28 DIAGNOSIS — Z9189 Other specified personal risk factors, not elsewhere classified: Secondary | ICD-10-CM

## 2023-01-28 DIAGNOSIS — N6092 Unspecified benign mammary dysplasia of left breast: Secondary | ICD-10-CM

## 2023-04-12 ENCOUNTER — Ambulatory Visit
Admission: RE | Admit: 2023-04-12 | Discharge: 2023-04-12 | Disposition: A | Discharge status: Home / Self Care | Payer: Managed Care, Other (non HMO) | Source: Ambulatory Visit | Attending: Family Medicine | Admitting: Family Medicine

## 2023-04-12 ENCOUNTER — Other Ambulatory Visit: Payer: Self-pay | Admitting: Family Medicine

## 2023-04-12 DIAGNOSIS — Z0000 Encounter for general adult medical examination without abnormal findings: Secondary | ICD-10-CM

## 2023-10-19 ENCOUNTER — Emergency Department (HOSPITAL_BASED_OUTPATIENT_CLINIC_OR_DEPARTMENT_OTHER)

## 2023-10-19 ENCOUNTER — Emergency Department (HOSPITAL_BASED_OUTPATIENT_CLINIC_OR_DEPARTMENT_OTHER)
Admission: EM | Admit: 2023-10-19 | Discharge: 2023-10-19 | Disposition: A | Discharge status: Home / Self Care | Attending: Emergency Medicine | Admitting: Emergency Medicine

## 2023-10-19 ENCOUNTER — Encounter (HOSPITAL_BASED_OUTPATIENT_CLINIC_OR_DEPARTMENT_OTHER): Payer: Self-pay | Admitting: Emergency Medicine

## 2023-10-19 ENCOUNTER — Other Ambulatory Visit: Payer: Self-pay

## 2023-10-19 DIAGNOSIS — R0789 Other chest pain: Secondary | ICD-10-CM | POA: Insufficient documentation

## 2023-10-19 DIAGNOSIS — R079 Chest pain, unspecified: Secondary | ICD-10-CM

## 2023-10-19 DIAGNOSIS — R1030 Lower abdominal pain, unspecified: Secondary | ICD-10-CM | POA: Insufficient documentation

## 2023-10-19 LAB — CBC
HCT: 40.6 % (ref 36.0–46.0)
Hemoglobin: 13.8 g/dL (ref 12.0–15.0)
MCH: 30.2 pg (ref 26.0–34.0)
MCHC: 34 g/dL (ref 30.0–36.0)
MCV: 88.8 fL (ref 80.0–100.0)
Platelets: 329 10*3/uL (ref 150–400)
RBC: 4.57 MIL/uL (ref 3.87–5.11)
RDW: 12.7 % (ref 11.5–15.5)
WBC: 10.3 10*3/uL (ref 4.0–10.5)
nRBC: 0 % (ref 0.0–0.2)

## 2023-10-19 LAB — COMPREHENSIVE METABOLIC PANEL WITH GFR
ALT: 14 U/L (ref 0–44)
AST: 20 U/L (ref 15–41)
Albumin: 4.3 g/dL (ref 3.5–5.0)
Alkaline Phosphatase: 68 U/L (ref 38–126)
Anion gap: 14 (ref 5–15)
BUN: 12 mg/dL (ref 6–20)
CO2: 20 mmol/L — ABNORMAL LOW (ref 22–32)
Calcium: 10.3 mg/dL (ref 8.9–10.3)
Chloride: 101 mmol/L (ref 98–111)
Creatinine, Ser: 0.74 mg/dL (ref 0.44–1.00)
GFR, Estimated: >60 mL/min (ref >60)
Glucose, Bld: 87 mg/dL (ref 70–99)
Potassium: 3.9 mmol/L (ref 3.5–5.1)
Sodium: 135 mmol/L (ref 135–145)
Total Bilirubin: 0.5 mg/dL (ref 0.0–1.2)
Total Protein: 7.3 g/dL (ref 6.5–8.1)

## 2023-10-19 LAB — D-DIMER, QUANTITATIVE: D-Dimer, Quant: <0.27 ug{FEU}/mL (ref 0.00–0.50)

## 2023-10-19 LAB — TROPONIN T, HIGH SENSITIVITY: Troponin T High Sensitivity: <15 ng/L (ref <19)

## 2023-10-19 LAB — HCG, SERUM, QUALITATIVE: Preg, Serum: NEGATIVE

## 2023-10-19 NOTE — Discharge Instructions (Addendum)
 All the blood testing, EKG and x-ray were normal today.  Continue to try Tylenol  to see if that helps with the pain.  If you start having shortness of breath, passing out, fever or significant new abdominal pain or vomiting return to the emergency room

## 2023-10-19 NOTE — ED Triage Notes (Signed)
 Pt with mid sternal chest pain radiating through to back.  Intermittent. Woke her from sleep last night.  Has been on and off today at work. No n/v.  No shob.

## 2023-10-19 NOTE — ED Provider Notes (Signed)
 [facility] Provider Note   CSN: [id number] Arrival date & time: 10/19/23  1537     History  Chief Complaint  Patient presents with   Chest Pain    Wendy Bauer is a 48 y.o. female.  Patient is a 48 year old female with a history of chronic back pain, sleep disturbance who is presenting today with complaints of chest pain.  She reports she has had it for a while intermittently but is always gone away until it started last night and has not resolved.  She reports it kept waking her up and she tried taking some Tums but it did not affect the pain.  She did take some ibuprofen today as well but the pain has not resolved.  She denies any pleuritic component to the pain but it is slightly sore when pushing on her chest wall.  Does not seem to be affected by eating she denies any nausea, vomiting, fever, cough, shortness of breath.  She has noticed some minimal groin pain intermittently but no leg swelling or recent immobilization.  No recent surgeries.  She does not use any estrogen containing products and denies any tobacco history.  She does report her mom at the age of 41 had an MI and when she called her doctor today they recommended she come here for evaluation.  The history is provided by the patient and medical records.  Chest Pain      Home Medications Prior to Admission medications   Medication Sig Start Date End Date Taking? Authorizing Provider  ALPRAZolam (XANAX) 0.5 MG tablet Take 0.5 mg by mouth at bedtime as needed for anxiety.    [provider]  ibuprofen (ADVIL) 600 MG tablet Take 600 mg by mouth every 6 (six) hours as needed.    [provider]      Allergies    Patient has no known allergies.    Review of Systems   Review of Systems  Cardiovascular:  Positive for chest pain.    Physical Exam Updated Vital Signs BP (!) 122/91   Pulse 94   Temp 98 F (36.7 C) (Oral)   Resp 17   SpO2 100%   Physical Exam Vitals and nursing note reviewed.  Constitutional:      General: She is not in acute distress.    Appearance: She is well-developed.  HENT:     Head: Normocephalic and atraumatic.  Eyes:     Pupils: Pupils are equal, round, and reactive to light.  Cardiovascular:     Rate and Rhythm: Normal rate and regular rhythm.     Heart sounds: Normal heart sounds. No murmur heard.    No friction rub.  Pulmonary:     Effort: Pulmonary effort is normal.     Breath sounds: Normal breath sounds. No wheezing or rales.    Chest:     Chest wall: Tenderness present.    Abdominal:     General: Bowel sounds are normal. There is no distension.     Palpations: Abdomen is soft.     Tenderness: There is no abdominal tenderness. There is no guarding or rebound.  Musculoskeletal:        General: No tenderness. Normal range of motion.     Right lower leg: No edema.     Left lower leg: No edema.     Comments: No edema  Skin:    General: Skin is warm and dry.     Findings:  No rash.  Neurological:     Mental Status: She is alert and oriented to person, place, and time.     Cranial Nerves: No cranial nerve deficit.  Psychiatric:        Behavior: Behavior normal.     ED Results / Procedures / Treatments   Labs (all labs ordered are listed, but only abnormal results are displayed) Labs Reviewed  COMPREHENSIVE METABOLIC PANEL WITH GFR - Abnormal; Notable for the following components:      Result Value   CO2 20 (*)    All other components within normal limits  CBC  D-DIMER, QUANTITATIVE  HCG, SERUM, QUALITATIVE  TROPONIN T, HIGH SENSITIVITY    EKG EKG Interpretation Date/Time:  Wednesday October 19 2023 15:49:39 EDT Ventricular Rate:  91 PR Interval:  124 QRS Duration:  86 QT Interval:  350 QTC Calculation: 431 R Axis:   65  Text Interpretation: Sinus rhythm  Normal ECG  No previous tracing   Confirmed by Almond Army ([id number]) on 10/19/2023 3:58:26 PM  Radiology DG  Chest 2 View Result Date: 10/19/2023 CLINICAL DATA:  Chest pain. Midsternal chest pain radiating to the back intermittently. EXAM: CHEST - 2 VIEW COMPARISON:  09/18/2019 FINDINGS: Normal heart size and pulmonary vascularity. No focal airspace disease or consolidation in the lungs. No blunting of costophrenic angles. No pneumothorax. Mediastinal contours appear intact. Nodular opacity seen previously in the right lung base is not identified today, possibly summation of shadows or inflammatory process. IMPRESSION: No active cardiopulmonary disease. Electronically Signed   By: Boyce Byes M.D.   On: 10/19/2023 16:40    Procedures Procedures    Medications Ordered in ED Medications - No data to display  ED Course/ Medical Decision Making/ A&P                                 Medical Decision Making Amount and/or Complexity of Data Reviewed External Data Reviewed: notes. Labs: ordered. Decision-making details documented in ED Course. Radiology: ordered and independent interpretation performed. Decision-making details documented in ED Course. ECG/medicine tests: ordered and independent interpretation performed. Decision-making details documented in ED Course.   Pt with multiple medical problems and comorbidities and presenting today with a complaint that caries a high risk for morbidity and mortality.  Here today with complaint of nonspecific chest pain.  Vital signs are reassuring, O2 sat is 100% on room air.  Patient is low risk Wells criteria and we will do a D-dimer to rule out PE.  Low suspicion for dissection, pneumonia, pneumothorax.  Possibility for GI etiology versus musculoskeletal.  No findings consistent with shingles. I independently interpreted patient's EKG and labs.  EKG is normal.  CBC CMP, D-dimer and troponin and hCG are all within normal limits.  I have independently visualized and interpreted pt's images today. CXR nwl.  Endings discussed with patient.  At this time no  indication for further testing but did encourage her to follow-up with her doctor if symptoms do not resolve.         Final Clinical Impression(s) / ED Diagnoses Final diagnoses:  Nonspecific chest pain    Rx / DC Orders ED Discharge Orders     None         Almond Army, MD 10/19/23 1714
